# Patient Record
Sex: Male | Born: 1995 | Race: Black or African American | Hispanic: No | Marital: Single | Smoking: Former smoker
Health system: Southern US, Community
[De-identification: ages and names within clinical notes are randomized; demographics above are authoritative.]

---
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
## 2014-02-24 ENCOUNTER — Telehealth: Payer: Self-pay | Admitting: Orthopedic Surgery
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
## 2016-02-01 ENCOUNTER — Other Ambulatory Visit: Payer: Self-pay | Admitting: Advanced Practice Midwife

## 2016-02-01 MED ORDER — AZITHROMYCIN 500 MG PO TABS: 1000.0000 mg | ORAL_TABLET | Freq: Once | ORAL | Status: DC

## 2016-02-01 NOTE — Progress Notes (Signed)
Girlfriend + Chlamydia.  rx azithromycin 1gm

## 2017-02-05 ENCOUNTER — Ambulatory Visit: Payer: Medicaid Other | Admitting: Family Medicine

## 2020-11-25 ENCOUNTER — Other Ambulatory Visit: Payer: Self-pay

## 2020-11-28 ENCOUNTER — Other Ambulatory Visit: Payer: Self-pay

## 2020-11-28 DIAGNOSIS — Z20822 Contact with and (suspected) exposure to covid-19: Secondary | ICD-10-CM

## 2020-11-30 LAB — SARS-COV-2, NAA 2 DAY TAT

## 2020-11-30 LAB — NOVEL CORONAVIRUS, NAA: SARS-CoV-2, NAA: DETECTED — AB

## 2022-04-06 ENCOUNTER — Emergency Department (HOSPITAL_COMMUNITY)
Admission: EM | Admit: 2022-04-06 | Discharge: 2022-04-06 | Disposition: A | Discharge status: Home / Self Care | Payer: BC Managed Care – PPO | Attending: Emergency Medicine | Admitting: Emergency Medicine

## 2022-04-06 ENCOUNTER — Emergency Department (HOSPITAL_COMMUNITY): Payer: BC Managed Care – PPO

## 2022-04-06 ENCOUNTER — Other Ambulatory Visit: Payer: Self-pay

## 2022-04-06 ENCOUNTER — Encounter (HOSPITAL_COMMUNITY): Payer: Self-pay | Admitting: *Deleted

## 2022-04-06 DIAGNOSIS — K610 Anal abscess: Secondary | ICD-10-CM | POA: Insufficient documentation

## 2022-04-06 DIAGNOSIS — L0231 Cutaneous abscess of buttock: Secondary | ICD-10-CM | POA: Diagnosis present

## 2022-04-06 LAB — CBC WITH DIFFERENTIAL/PLATELET
Abs Immature Granulocytes: 0.04 10*3/uL (ref 0.00–0.07)
Basophils Absolute: 0 10*3/uL (ref 0.0–0.1)
Basophils Relative: 0 %
Eosinophils Absolute: 0.2 10*3/uL (ref 0.0–0.5)
Eosinophils Relative: 2 %
HCT: 39.8 % (ref 39.0–52.0)
Hemoglobin: 12.6 g/dL — ABNORMAL LOW (ref 13.0–17.0)
Immature Granulocytes: 0 %
Lymphocytes Relative: 22 %
Lymphs Abs: 2 10*3/uL (ref 0.7–4.0)
MCH: 28.3 pg (ref 26.0–34.0)
MCHC: 31.7 g/dL (ref 30.0–36.0)
MCV: 89.2 fL (ref 80.0–100.0)
Monocytes Absolute: 0.6 10*3/uL (ref 0.1–1.0)
Monocytes Relative: 6 %
Neutro Abs: 6.3 10*3/uL (ref 1.7–7.7)
Neutrophils Relative %: 70 %
Platelets: 301 10*3/uL (ref 150–400)
RBC: 4.46 MIL/uL (ref 4.22–5.81)
RDW: 11.9 % (ref 11.5–15.5)
WBC: 9.1 10*3/uL (ref 4.0–10.5)
nRBC: 0 % (ref 0.0–0.2)

## 2022-04-06 LAB — BASIC METABOLIC PANEL
Anion gap: 7 (ref 5–15)
BUN: 10 mg/dL (ref 6–20)
CO2: 28 mmol/L (ref 22–32)
Calcium: 9.2 mg/dL (ref 8.9–10.3)
Chloride: 103 mmol/L (ref 98–111)
Creatinine, Ser: 0.93 mg/dL (ref 0.61–1.24)
GFR, Estimated: >60 mL/min (ref >60)
Glucose, Bld: 106 mg/dL — ABNORMAL HIGH (ref 70–99)
Potassium: 4.3 mmol/L (ref 3.5–5.1)
Sodium: 138 mmol/L (ref 135–145)

## 2022-04-06 MED ORDER — FENTANYL CITRATE PF 50 MCG/ML IJ SOSY
50.0000 ug | PREFILLED_SYRINGE | Freq: Once | INTRAMUSCULAR | Status: AC
  Administered 2022-04-06: 50 ug via INTRAVENOUS
  Filled 2022-04-06: qty 1

## 2022-04-06 MED ORDER — IOHEXOL 300 MG/ML  SOLN
100.0000 mL | Freq: Once | INTRAMUSCULAR | Status: AC | PRN
  Administered 2022-04-06: 100 mL via INTRAVENOUS

## 2022-04-06 MED ORDER — AMOXICILLIN-POT CLAVULANATE 875-125 MG PO TABS: 1.0000 | ORAL_TABLET | Freq: Two times a day (BID) | ORAL | 0 refills | Status: AC

## 2022-04-06 NOTE — ED Notes (Signed)
Patient transported to CT 

## 2022-04-06 NOTE — Discharge Instructions (Signed)
Please take antibiotics as prescribed.  Please follow-up with general surgery for further evaluation.  Please return to the emergency department for any worsening symptoms you might have.

## 2022-04-06 NOTE — ED Provider Notes (Signed)
[facility] EMERGENCY DEPARTMENT Provider Note   CSN: [id number] Arrival date & time: 04/06/22  1335     History Chief Complaint  Patient presents with   Abscess    Kevin Estrada is a 26 y.o. male who presents to the emergency department today with an abscess over the left inner butt cheek.  Patient was seen and evaluated urgent care just prior to arrival and was sent here for further evaluation.  Abscess been there for 2 days.  Is not draining.  No fever or chills.  Pain is worse when he is lying supine or with direct palpation of the abscess.  He never had this problem for.   Abscess     Home Medications Prior to Admission medications   Medication Sig Start Date End Date Taking? Authorizing Provider  amoxicillin-clavulanate (AUGMENTIN) 875-125 MG tablet Take 1 tablet by mouth every 12 (twelve) hours. 04/06/22  Yes Honor Loh M, PA-C      Allergies    Patient has no known allergies.    Review of Systems   Review of Systems  All other systems reviewed and are negative.  Physical Exam Updated Vital Signs Ht 5' 9.5" (1.765 m)   Wt 80.6 kg   BMI 25.86 kg/m  Physical Exam Vitals and nursing note reviewed.  Constitutional:      General: He is not in acute distress.    Appearance: Normal appearance.  HENT:     Head: Normocephalic and atraumatic.  Eyes:     General:        Right eye: No discharge.        Left eye: No discharge.  Cardiovascular:     Comments: Regular rate and rhythm.  S1/S2 are distinct without any evidence of murmur, rubs, or gallops.  Radial pulses are 2+ bilaterally.  Dorsalis pedis pulses are 2+ bilaterally.  No evidence of pedal edema. Pulmonary:     Comments: Clear to auscultation bilaterally.  Normal effort.  No respiratory distress.  No evidence of wheezes, rales, or rhonchi heard throughout. Abdominal:     General: Abdomen is flat. Bowel sounds are normal. There is no distension.     Tenderness: There is no abdominal tenderness. There is  no guarding or rebound.  Genitourinary:    Comments: There is an area of erythema and slight swelling over the left inner buttock.  Difficult to assess whether or not this communicates with the rectum.  Not actively draining.  No obvious purulence that I can see. Musculoskeletal:        General: Normal range of motion.     Cervical back: Neck supple.  Skin:    General: Skin is warm and dry.     Findings: No rash.  Neurological:     General: No focal deficit present.     Mental Status: He is alert.  Psychiatric:        Mood and Affect: Mood normal.        Behavior: Behavior normal.    ED Results / Procedures / Treatments   Labs (all labs ordered are listed, but only abnormal results are displayed) Labs Reviewed  CBC WITH DIFFERENTIAL/PLATELET - Abnormal; Notable for the following components:      Result Value   Hemoglobin 12.6 (*)    All other components within normal limits  BASIC METABOLIC PANEL - Abnormal; Notable for the following components:   Glucose, Bld 106 (*)    All other components within normal limits    EKG None  Radiology CT PELVIS W CONTRAST  Result Date: 04/06/2022 CLINICAL DATA:  Abscess right inner buttocks. EXAM: CT PELVIS WITH CONTRAST TECHNIQUE: Multidetector CT imaging of the pelvis was performed using the standard protocol following the bolus administration of intravenous contrast. RADIATION DOSE REDUCTION: This exam was performed according to the departmental dose-optimization program which includes automated exposure control, adjustment of the mA and/or kV according to patient size and/or use of iterative reconstruction technique. CONTRAST:  OMNIPAQUE IOHEXOL 300 MG/ML  SOLN COMPARISON:  None Available. FINDINGS: Urinary Tract:  No abnormality visualized. Bowel:  Unremarkable visualized pelvic bowel loops. Vascular/Lymphatic: No pathologically enlarged lymph nodes. No significant vascular abnormality seen. Reproductive:  Prostate gland within normal  limits. Other: No free fluid. No focal abdominal wall hernia. There is a low-attenuation area with enhancing rim and mild surrounding inflammatory stranding in the inferior medial left buttocks just beneath the skin surface. This approximates the inferior margin of the anus. It measures 1.0 x 2.2 x 2.1 cm. Musculoskeletal: No suspicious bone lesions identified. IMPRESSION: 1. 1.0 x 2.2 x 2.1 cm enhancing low-density collection with surrounding inflammation in the inferior left buttocks inferior to the anus worrisome for perianal abscess. Electronically Signed   By: Darliss Cheney M.D.   On: 04/06/2022 15:49    Procedures .Marland KitchenIncision and Drainage  Date/Time: 04/06/2022 4:57 PM Performed by: Teressa Lower, PA-C Authorized by: Teressa Lower, PA-C   Consent:    Consent obtained:  Verbal   Consent given by:  Patient   Risks, benefits, and alternatives were discussed: yes     Risks discussed:  Bleeding and incomplete drainage   Alternatives discussed:  Delayed treatment Universal protocol:    Procedure explained and questions answered to patient or proxy's satisfaction: yes     Relevant documents present and verified: yes     Test results available : yes     Imaging studies available: yes     Required blood products, implants, devices, and special equipment available: no     Site/side marked: no     Immediately prior to procedure, a time out was called: no     Patient identity confirmed:  Verbally with patient and arm band Location:    Type:  Abscess   Size:  4cm   Location:  Anogenital   Anogenital location:  Gluteal cleft Pre-procedure details:    Skin preparation:  Povidone-iodine Sedation:    Sedation type:  None (Fentanyl for pain control) Anesthesia:    Anesthesia method:  None Procedure type:    Complexity:  Complex Procedure details:    Ultrasound guidance: yes     Needle aspiration: no     Incision depth:  Dermal   Wound management:  Probed and deloculated and  irrigated with saline   Drainage:  Bloody and purulent   Drainage amount:  Copious   Wound treatment:  Wound left open   Packing materials:  None Post-procedure details:    Procedure completion:  Tolerated well, no immediate complications    Medications Ordered in ED Medications  iohexol (OMNIPAQUE) 300 MG/ML solution 100 mL (100 mLs Intravenous Contrast Given 04/06/22 1531)  fentaNYL (SUBLIMAZE) injection 50 mcg (50 mcg Intravenous Given 04/06/22 1636)    ED Course/ Medical Decision Making/ A&P Clinical Course as of 04/06/22 1707  Fri Apr 06, 2022  1600 CT PELVIS W CONTRAST I personally ordered and interpreted CT pelvis with contrast which did show abscess over the left buttock close to the perianal area. [CF]  1659  I spoke with Dr. Henreitta LeberBridges with general surgery who recommended performing I&D at bedside and to have him follow-up with her in the office. [CF]  1701 CBC with Differential(!) No evidence of leukocytosis or significant anemia. [CF]  1701 Basic metabolic panel(!) No evidence of electrolyte abnormalities. [CF]    Clinical Course User Index [CF] Teressa LowerFleming, Jetson Pickrel M, PA-C                           Medical Decision Making Kevin Estrada is a 26 y.o. male who presents to the emergency department for further evaluation of perirectal pain and swelling.  Differential diagnosis includes abscess, cellulitis, external hemorrhoid.   Amount and/or Complexity of Data Reviewed Independent Historian:     Details: Significnat other provided history Labs: ordered. Decision-making details documented in ED Course. Radiology: ordered and independent interpretation performed. Decision-making details documented in ED Course.  Risk OTC drugs. Prescription drug management. Parenteral controlled substances. Decision regarding hospitalization. Risk Details: Patient found to have perianal abscess on exam and imaging.  I spoke with general surgery who recommended performing I&D at bedside and  have him follow-up in the outpatient setting.  I successfully performed I&D at bedside with ultrasound guidance, pain medication, and suction.  Copious amount of purulent drainage was obtained.  Please see procedure note for further detail.  We will cover him with Augmentin to be safe.  I instructed him on performing sitz bath's at home.  I will have him follow-up with general surgery.  Strict return precautions were discussed.  He is safe for discharge.    Final Clinical Impression(s) / ED Diagnoses Final diagnoses:  Perianal abscess    Rx / DC Orders ED Discharge Orders          Ordered    amoxicillin-clavulanate (AUGMENTIN) 875-125 MG tablet  Every 12 hours        04/06/22 1704              Teressa LowerFleming, Caitlyn Buchanan M, PA-C 04/06/22 1707    Benjiman CorePickering, Nathan, MD 04/07/22 0700

## 2022-04-06 NOTE — ED Triage Notes (Signed)
Pt reports abcess to the R inner buttocks, pt reports pain with sitting, pt denies fever & chills, ambulatory

## 2022-04-10 ENCOUNTER — Ambulatory Visit: Payer: Self-pay | Admitting: General Surgery

## 2023-05-03 ENCOUNTER — Encounter (HOSPITAL_COMMUNITY): Payer: Self-pay | Admitting: Emergency Medicine

## 2023-05-03 ENCOUNTER — Other Ambulatory Visit (HOSPITAL_COMMUNITY): Payer: Self-pay

## 2023-05-03 ENCOUNTER — Emergency Department (HOSPITAL_COMMUNITY)
Admission: EM | Admit: 2023-05-03 | Discharge: 2023-05-03 | Disposition: A | Discharge status: Home / Self Care | Payer: BC Managed Care – PPO | Attending: Emergency Medicine | Admitting: Emergency Medicine

## 2023-05-03 ENCOUNTER — Other Ambulatory Visit: Payer: Self-pay

## 2023-05-03 DIAGNOSIS — K610 Anal abscess: Secondary | ICD-10-CM

## 2023-05-03 MED ORDER — AMOXICILLIN-POT CLAVULANATE 875-125 MG PO TABS
1.0000 | ORAL_TABLET | Freq: Two times a day (BID) | ORAL | 0 refills | Status: AC
  Filled 2023-05-03: qty 14, 7d supply, fill #0

## 2023-05-03 MED ORDER — LIDOCAINE HCL (PF) 1 % IJ SOLN
10.0000 mL | Freq: Once | INTRAMUSCULAR | Status: AC
  Administered 2023-05-03: 10 mL
  Filled 2023-05-03: qty 10

## 2023-05-03 MED ORDER — OXYCODONE-ACETAMINOPHEN 5-325 MG PO TABS
1.0000 | ORAL_TABLET | Freq: Once | ORAL | Status: AC
  Administered 2023-05-03: 1 via ORAL
  Filled 2023-05-03: qty 1

## 2023-05-03 NOTE — ED Provider Notes (Signed)
[place] EMERGENCY DEPARTMENT AT [facility] Provider Note   CSN: [id number] Arrival date & time: 05/03/23  1144     History {Add pertinent medical, surgical, social history, OB history to HPI:1} Chief Complaint  Patient presents with   Abscess    Jaquille Stickle is a 27 y.o. male with a past medical history of perianal abscess presents today for evaluation of abscess.  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  The abscess was drained last year.  He noticed the abscess again about a week ago.  He denies any pus or fluid drainage.  States it is painful when he sits down.  He has not tried any pain medication at home.  He denies any fever, nausea or vomiting.   Abscess    History reviewed. No pertinent past medical history. History reviewed. No pertinent surgical history.   Home Medications Prior to Admission medications   Medication Sig Start Date End Date Taking? Authorizing Provider  amoxicillin-clavulanate (AUGMENTIN) 875-125 MG tablet Take 1 tablet by mouth every 12 (twelve) hours. 04/06/22   Teressa Lower, PA-C      Allergies    Patient has no known allergies.    Review of Systems   Review of Systems Negative except as per HPI.  Physical Exam Updated Vital Signs BP (!) 147/80 (BP Location: Right Arm)   Pulse 96   Temp 98.7 F (37.1 C) (Oral)   Resp 16   Ht 5' 9.5" (1.765 m)   Wt 83 kg   SpO2 100%   BMI 26.64 kg/m  Physical Exam Vitals and nursing note reviewed.  Constitutional:      Appearance: Normal appearance.  HENT:     Head: Normocephalic and atraumatic.     Mouth/Throat:     Mouth: Mucous membranes are moist.  Eyes:     General: No scleral icterus. Cardiovascular:     Rate and Rhythm: Normal rate and regular rhythm.     Pulses: Normal pulses.     Heart sounds: Normal heart sounds.  Pulmonary:     Effort: Pulmonary effort is normal.     Breath sounds: Normal breath sounds.  Abdominal:      General: Abdomen is flat.     Palpations: Abdomen is soft.     Tenderness: There is no abdominal tenderness.  Musculoskeletal:        General: No deformity.  Skin:    General: Skin is warm.     Findings: No rash.  Neurological:     General: No focal deficit present.     Mental Status: He is alert.  Psychiatric:        Mood and Affect: Mood normal.     ED Results / Procedures / Treatments   Labs (all labs ordered are listed, but only abnormal results are displayed) Labs Reviewed - No data to display  EKG None  Radiology No results found.  Procedures .Marland KitchenIncision and Drainage  Date/Time: 05/03/2023 2:13 PM  Performed by: Jeanelle Malling, PA Authorized by: Jeanelle Malling, PA   Consent:    Consent obtained:  Verbal   Consent given by:  Patient   Risks discussed:  Bleeding, incomplete drainage, pain and damage to other organs   Alternatives discussed:  No treatment Universal protocol:    Procedure explained and questions answered to patient or proxy's satisfaction: yes     Relevant documents present and verified: yes     Test results available : yes  Imaging studies available: yes     Required blood products, implants, devices, and special equipment available: yes     Site/side marked: yes     Immediately prior to procedure, a time out was called: yes     Patient identity confirmed:  Verbally with patient and arm band Location:    Type:  Abscess   Size:  1 x 1 x 1 cm   Location: Perianal. Pre-procedure details:    Skin preparation:  Povidone-iodine Anesthesia:    Anesthesia method:  Local infiltration   Local anesthetic:  Lidocaine 1% w/o epi Procedure type:    Complexity:  Complex Procedure details:    Incision types:  Single straight   Incision depth:  Subcutaneous   Wound management:  Probed and deloculated, irrigated with saline and extensive cleaning   Drainage:  Purulent   Drainage amount:  Moderate   Packing materials:  1/4 in iodoform gauze   Amount 1/4"  iodoform:  3 Post-procedure details:    Procedure completion:  Tolerated well, no immediate complications   {Document cardiac monitor, telemetry assessment procedure when appropriate:1}  Medications Ordered in ED Medications  lidocaine (PF) (XYLOCAINE) 1 % injection 10 mL (has no administration in time range)  oxyCODONE-acetaminophen (PERCOCET/ROXICET) 5-325 MG per tablet 1 tablet (1 tablet Oral Given 05/03/23 1311)    ED Course/ Medical Decision Making/ A&P   {   Click here for ABCD2, HEART and other calculatorsREFRESH Note before signing :1}                          Medical Decision Making Risk Prescription drug management.   This patient presents to the ED for abscess, this involves an extensive number of treatment options, and is a complaint that carries with a high risk of complications and morbidity.  The differential diagnosis includes perianal abscess, pilonidal cyst.  This is not an exhaustive list.  Imaging studies: Bedside ultrasound was done by his EDP show a 1 x 1 x 1 cm cyst near the rectum on the left buttocks.  Problem list/ ED course/ Critical interventions/ Medical management: HPI: See above Vital signs within normal range and stable throughout visit. Laboratory/imaging studies significant for: See above. On physical examination, patient is afebrile and appears in no acute distress.  Bedside ultrasound was done show a 1 x 1 x 1 cm perianal abscess near the rectum on the left buttocks.  There was surrounding induration and erythema, concerning for a perianal abscess.  The abscess was anesthetized with lidocaine and then I&D was performed with inoculation and purulent was expressed.  There is no lymphangitic spreading visible.  Low concern for osteomyelitis.  Patient is not immunocompromise.  Unlikely necrotizing fasciitis, patient has no bullae, pain out of proportion or rapid progression.  Patient to be discharged home with Augmentin with follow-up with PCP. I have  reviewed the patient home medicines and have made adjustments as needed.  Cardiac monitoring/EKG: The patient was maintained on a cardiac monitor.  I personally reviewed and interpreted the cardiac monitor which showed an underlying rhythm of: sinus rhythm.  Additional history obtained: External records from outside source obtained and reviewed including: Chart review including previous notes, labs, imaging.  Consultations obtained:  Disposition Continued outpatient therapy. Follow-up with PCP recommended for reevaluation of symptoms. Treatment plan discussed with patient.  Pt acknowledged understanding was agreeable to the plan. Worrisome signs and symptoms were discussed with patient, and patient acknowledged understanding to return to  the ED if they noticed these signs and symptoms. Patient was stable upon discharge.   This chart was dictated using voice recognition software.  Despite best efforts to proofread,  errors can occur which can change the documentation meaning.    {Document critical care time when appropriate:1} {Document review of labs and clinical decision tools ie heart score, Chads2Vasc2 etc:1}  {Document your independent review of radiology images, and any outside records:1} {Document your discussion with family members, caretakers, and with consultants:1} {Document social determinants of health affecting pt's care:1} {Document your decision making why or why not admission, treatments were needed:1} Final Clinical Impression(s) / ED Diagnoses Final diagnoses:  Perianal abscess    Rx / DC Orders ED Discharge Orders     None

## 2023-05-03 NOTE — Discharge Instructions (Addendum)
Please take your antibiotics as prescribed. Take tylenol/ibuprofen every 6 hours for pain.  Return to an urgent care in 2 or 3 days for packing removal and wound check. Please do not hesitate to return to emergency department if worrisome signs symptoms we discussed become apparent.

## 2023-05-03 NOTE — ED Triage Notes (Signed)
Pt via POV c/o recurrent abscess in gluteal fold on left buttock. Current abscess was first noticeable about a week ago but now the discomfort has increased and pt is unable to tolerate the pain. Pt took ibuprofen this morning with minor relief of pain.

## 2023-12-19 IMAGING — CT CT PELVIS W/ CM
2 of 4 series · 17 of 46 positions shown, 19 images · IV contrast (Omnipaque or Isovue)
Comparison: None Available.

CLINICAL DATA: Abscess right inner buttocks.

EXAM:
CT PELVIS WITH CONTRAST
TECHNIQUE: Multidetector CT imaging of the pelvis was performed using the
standard protocol following the bolus administration of intravenous
contrast.

[Series 2: axial st · axial · 0.59mm/px · z∈[+818,+1103]mm · 14 of 67 slices shown, 16 images]
[im 5/67  soft-tissue]
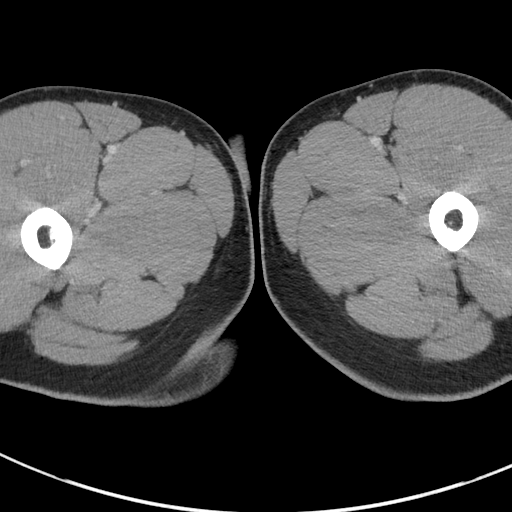
[im 5/67  bone]
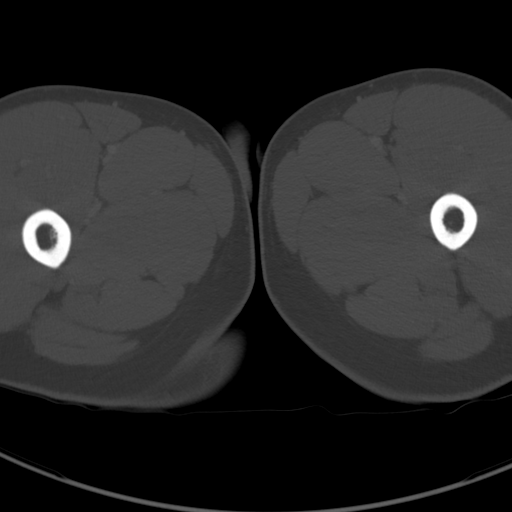
[im 9/67  soft-tissue]
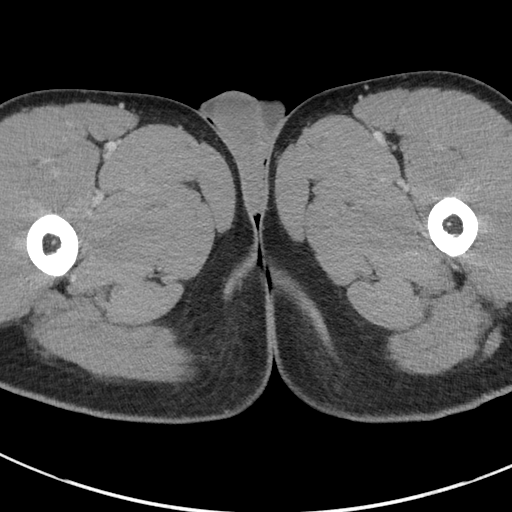
[im 14/67  soft-tissue]
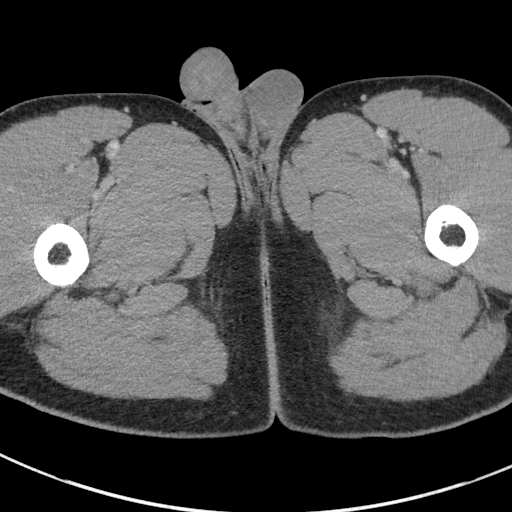
[im 18/67  soft-tissue]
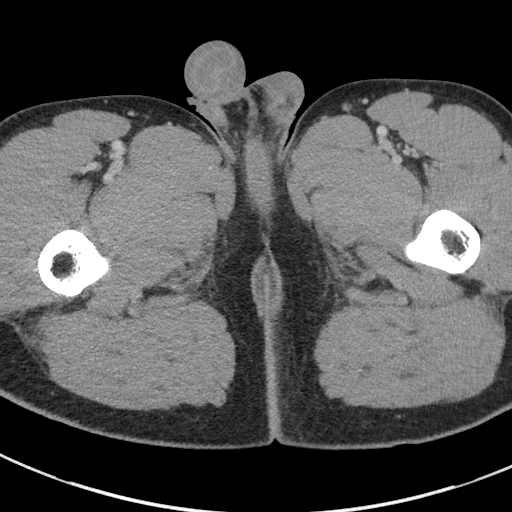
[im 23/67  soft-tissue]
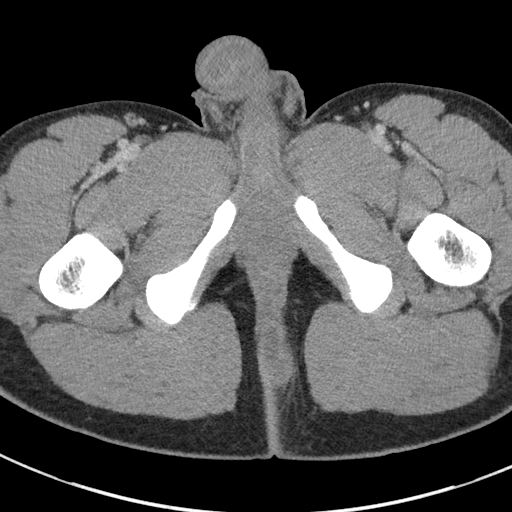
[im 27/67  soft-tissue]
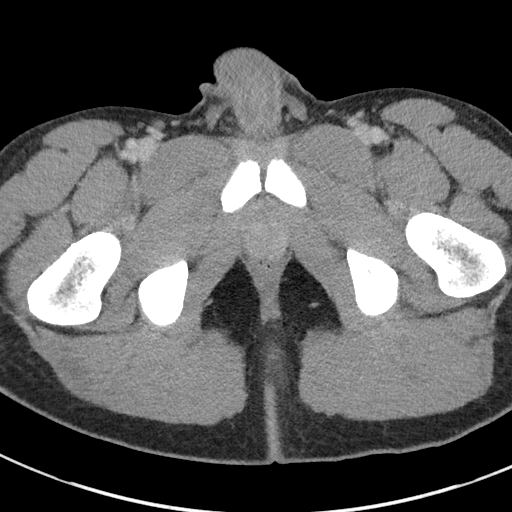
[im 31/67  soft-tissue]
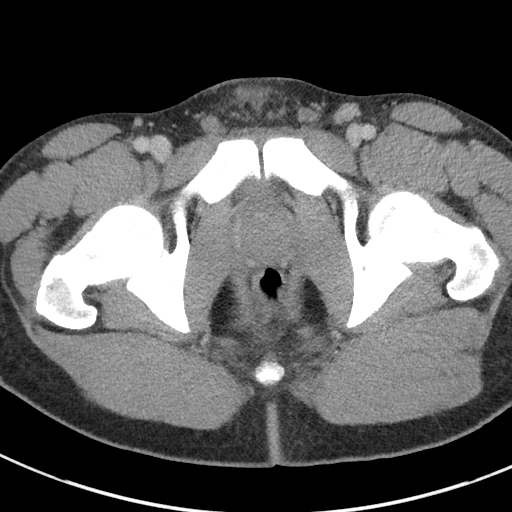
[im 36/67  soft-tissue]
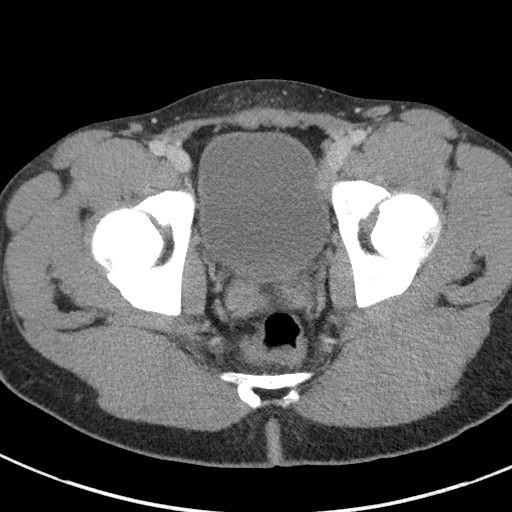
[im 40/67  soft-tissue]
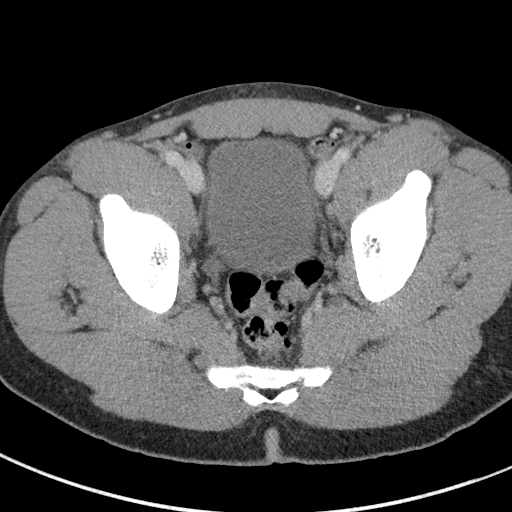
[im 40/67  bone]
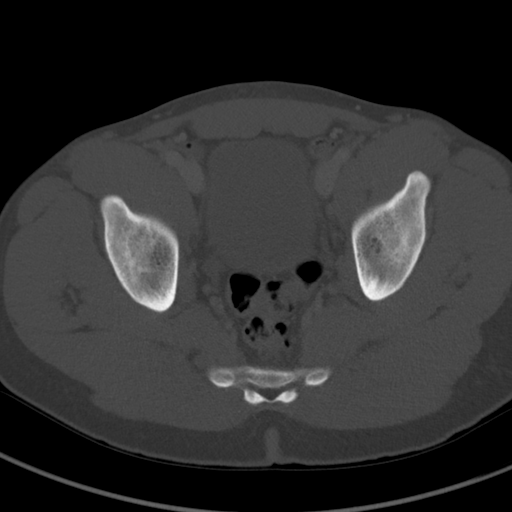
[im 45/67  soft-tissue]
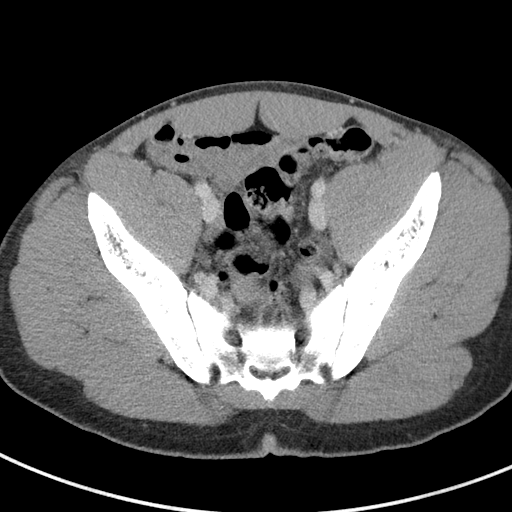
[im 49/67  soft-tissue]
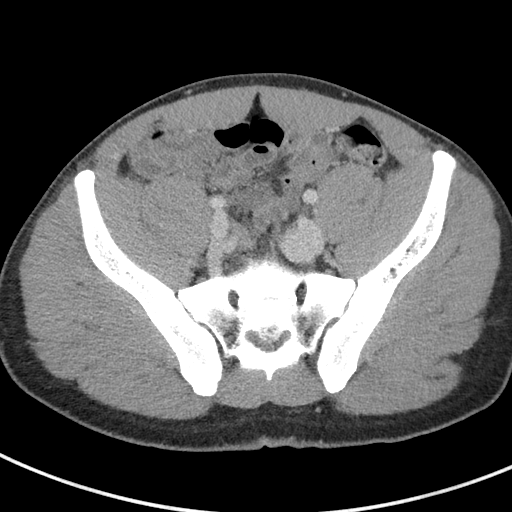
[im 53/67  soft-tissue]
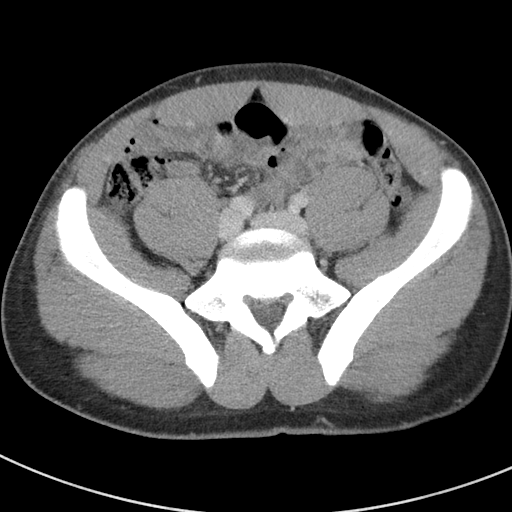
[im 58/67  soft-tissue]
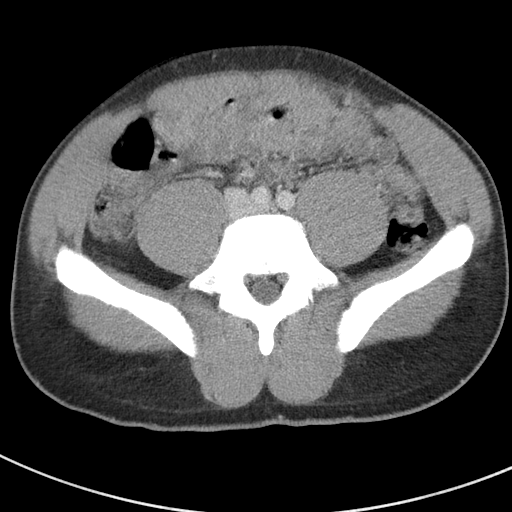
[im 62/67  soft-tissue]
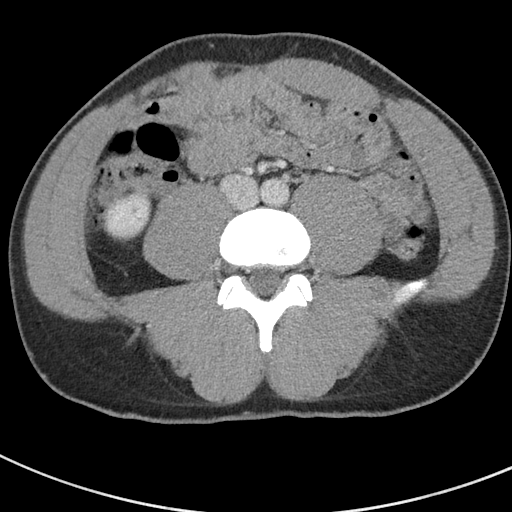

[Series 4: coronal st · coronal · 0.67mm/px · 3 of 91 slices shown]
[im 31/91  soft-tissue]
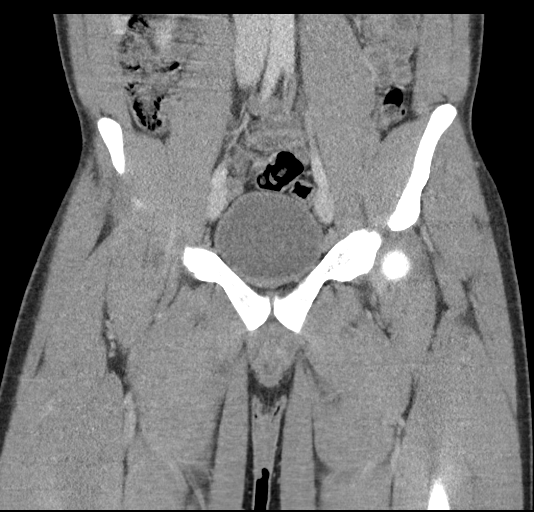
[im 41/91  soft-tissue]
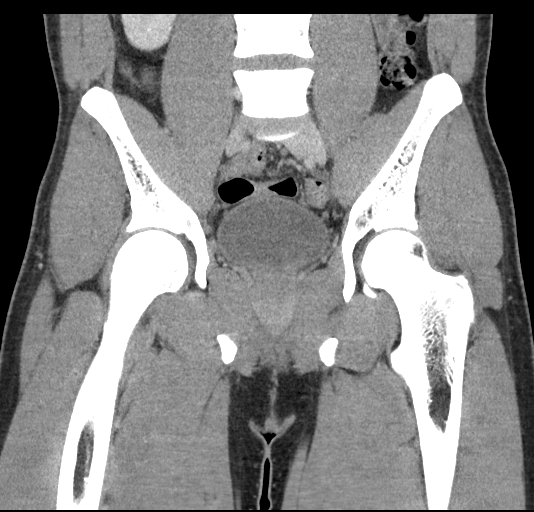
[im 51/91  soft-tissue]
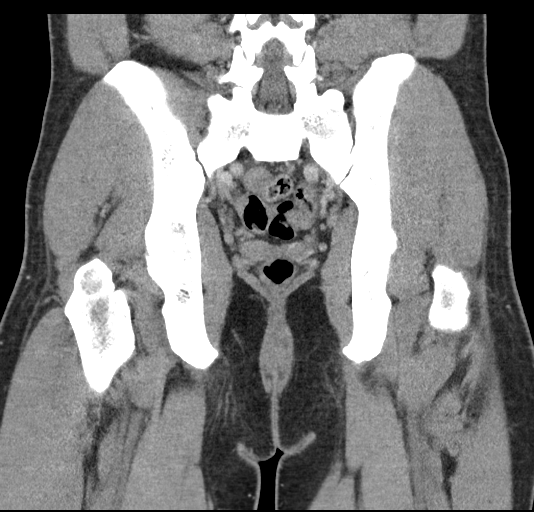

[17 of 46 positions shown; findings below may reference images not displayed]

RADIATION DOSE REDUCTION: This exam was performed according to the
departmental dose-optimization program which includes automated
exposure control, adjustment of the mA and/or kV according to
patient size and/or use of iterative reconstruction technique.

CONTRAST:  100mL OMNIPAQUE IOHEXOL 300 MG/ML  SOLN
FINDINGS: Urinary Tract:  No abnormality visualized.

Bowel:  Unremarkable visualized pelvic bowel loops.

Vascular/Lymphatic: No pathologically enlarged lymph nodes. No
significant vascular abnormality seen.

Reproductive:  Prostate gland within normal limits.

Other: No free fluid. No focal abdominal wall hernia. There is a
low-attenuation area with enhancing rim and mild surrounding
inflammatory stranding in the inferior medial left buttocks just
beneath the skin surface. This approximates the inferior margin of
the anus. It measures 1.0 x 2.2 x 2.1 cm.

Musculoskeletal: No suspicious bone lesions identified.
IMPRESSION: 1. 1.0 x 2.2 x 2.1 cm enhancing low-density collection with
surrounding inflammation in the inferior left buttocks inferior to
the anus worrisome for perianal abscess.
